# Patient Record
Sex: Male | Born: 1960 | Race: Black or African American | Hispanic: No | Smoking: Former smoker
Health system: Southern US, Community
[De-identification: ages and names within clinical notes are randomized; demographics above are authoritative.]

## PROBLEM LIST (undated history)

## (undated) DIAGNOSIS — J45909 Unspecified asthma, uncomplicated: Secondary | ICD-10-CM

## (undated) HISTORY — PX: PACEMAKER INSERTION: SHX728

---

## 2018-10-17 ENCOUNTER — Emergency Department (HOSPITAL_BASED_OUTPATIENT_CLINIC_OR_DEPARTMENT_OTHER): Payer: Medicaid - Out of State

## 2018-10-17 ENCOUNTER — Encounter (HOSPITAL_BASED_OUTPATIENT_CLINIC_OR_DEPARTMENT_OTHER): Payer: Self-pay | Admitting: Emergency Medicine

## 2018-10-17 ENCOUNTER — Other Ambulatory Visit: Payer: Self-pay

## 2018-10-17 ENCOUNTER — Emergency Department (HOSPITAL_BASED_OUTPATIENT_CLINIC_OR_DEPARTMENT_OTHER)
Admission: EM | Admit: 2018-10-17 | Discharge: 2018-10-17 | Disposition: A | Discharge status: Home / Self Care | Payer: Medicaid - Out of State | Attending: Emergency Medicine | Admitting: Emergency Medicine

## 2018-10-17 DIAGNOSIS — R0602 Shortness of breath: Secondary | ICD-10-CM | POA: Diagnosis present

## 2018-10-17 DIAGNOSIS — Z87891 Personal history of nicotine dependence: Secondary | ICD-10-CM | POA: Diagnosis not present

## 2018-10-17 DIAGNOSIS — J4541 Moderate persistent asthma with (acute) exacerbation: Secondary | ICD-10-CM | POA: Insufficient documentation

## 2018-10-17 DIAGNOSIS — Z20828 Contact with and (suspected) exposure to other viral communicable diseases: Secondary | ICD-10-CM | POA: Diagnosis not present

## 2018-10-17 DIAGNOSIS — R0789 Other chest pain: Secondary | ICD-10-CM | POA: Insufficient documentation

## 2018-10-17 HISTORY — DX: Unspecified asthma, uncomplicated: J45.909

## 2018-10-17 LAB — SARS CORONAVIRUS 2 BY RT PCR (HOSPITAL ORDER, PERFORMED IN ~~LOC~~ HOSPITAL LAB): SARS Coronavirus 2: NEGATIVE

## 2018-10-17 LAB — BASIC METABOLIC PANEL
Anion gap: 8 (ref 5–15)
BUN: 8 mg/dL (ref 6–20)
CO2: 27 mmol/L (ref 22–32)
Calcium: 9 mg/dL (ref 8.9–10.3)
Chloride: 107 mmol/L (ref 98–111)
Creatinine, Ser: 0.95 mg/dL (ref 0.61–1.24)
GFR calc Af Amer: >60 mL/min (ref >60)
GFR calc non Af Amer: >60 mL/min (ref >60)
Glucose, Bld: 107 mg/dL — ABNORMAL HIGH (ref 70–99)
Potassium: 3.9 mmol/L (ref 3.5–5.1)
Sodium: 142 mmol/L (ref 135–145)

## 2018-10-17 LAB — CBC WITH DIFFERENTIAL/PLATELET
Abs Immature Granulocytes: 0.01 10*3/uL (ref 0.00–0.07)
Basophils Absolute: 0 10*3/uL (ref 0.0–0.1)
Basophils Relative: 1 %
Eosinophils Absolute: 0.4 10*3/uL (ref 0.0–0.5)
Eosinophils Relative: 9 %
HCT: 39.5 % (ref 39.0–52.0)
Hemoglobin: 13.1 g/dL (ref 13.0–17.0)
Immature Granulocytes: 0 %
Lymphocytes Relative: 39 %
Lymphs Abs: 1.7 10*3/uL (ref 0.7–4.0)
MCH: 31 pg (ref 26.0–34.0)
MCHC: 33.2 g/dL (ref 30.0–36.0)
MCV: 93.6 fL (ref 80.0–100.0)
Monocytes Absolute: 0.4 10*3/uL (ref 0.1–1.0)
Monocytes Relative: 9 %
Neutro Abs: 1.9 10*3/uL (ref 1.7–7.7)
Neutrophils Relative %: 42 %
Platelets: 158 10*3/uL (ref 150–400)
RBC: 4.22 MIL/uL (ref 4.22–5.81)
RDW: 13.6 % (ref 11.5–15.5)
WBC: 4.5 10*3/uL (ref 4.0–10.5)
nRBC: 0 % (ref 0.0–0.2)

## 2018-10-17 LAB — TROPONIN I (HIGH SENSITIVITY): Troponin I (High Sensitivity): 12 ng/L (ref <18)

## 2018-10-17 MED ORDER — PREDNISONE 20 MG PO TABS: 60.0000 mg | ORAL_TABLET | Freq: Every day | ORAL | 0 refills | Status: AC

## 2018-10-17 MED ORDER — MAGNESIUM SULFATE 2 GM/50ML IV SOLN
2.0000 g | Freq: Once | INTRAVENOUS | Status: AC
  Administered 2018-10-17: 04:00:00 2 g via INTRAVENOUS
  Filled 2018-10-17: qty 50

## 2018-10-17 MED ORDER — IPRATROPIUM BROMIDE HFA 17 MCG/ACT IN AERS
4.0000 | INHALATION_SPRAY | Freq: Once | RESPIRATORY_TRACT | Status: AC
  Administered 2018-10-17: 04:00:00 4 via RESPIRATORY_TRACT

## 2018-10-17 MED ORDER — ALBUTEROL SULFATE HFA 108 (90 BASE) MCG/ACT IN AERS: 2.0000 | INHALATION_SPRAY | RESPIRATORY_TRACT | 1 refills | Status: AC | PRN

## 2018-10-17 MED ORDER — ALBUTEROL SULFATE HFA 108 (90 BASE) MCG/ACT IN AERS
8.0000 | INHALATION_SPRAY | Freq: Once | RESPIRATORY_TRACT | Status: AC
  Administered 2018-10-17: 8 via RESPIRATORY_TRACT

## 2018-10-17 MED ORDER — METHYLPREDNISOLONE SODIUM SUCC 125 MG IJ SOLR
125.0000 mg | Freq: Once | INTRAMUSCULAR | Status: AC
  Administered 2018-10-17: 125 mg via INTRAVENOUS
  Filled 2018-10-17: qty 2

## 2018-10-17 MED ORDER — ALBUTEROL (5 MG/ML) CONTINUOUS INHALATION SOLN
10.0000 mg/h | INHALATION_SOLUTION | RESPIRATORY_TRACT | Status: DC
  Administered 2018-10-17: 10 mg/h via RESPIRATORY_TRACT
  Filled 2018-10-17: qty 20

## 2018-10-17 NOTE — ED Triage Notes (Signed)
Pt states he has asthma and his chest started getting tight about an hour ago  Pt states he has wheezing  Pt is out of his inhaler

## 2018-10-17 NOTE — ED Provider Notes (Addendum)
TIME SEEN: 3:35 AM  CHIEF COMPLAINT: Chest tightness, shortness of breath, wheezing  HPI: Patient is a 58 year old male with history of asthma with multiple previous admissions for asthma exacerbations who presents to the emergency department several hours of wheezing, shortness of breath and chest tightness that feels similar to his previous asthma exacerbations.  No fever or cough.  States he has recently had a negative COVID test within the past 2 weeks.  No lower extremity swelling or pain.  He has never been intubated for his asthma before.  He has been out of his inhalers for several days.  ROS: See HPI Constitutional: no fever  Eyes: no drainage  ENT: no runny nose   Cardiovascular:   chest pain  Resp:  SOB  GI: no vomiting GU: no dysuria Integumentary: no rash  Allergy: no hives  Musculoskeletal: no leg swelling  Neurological: no slurred speech ROS otherwise negative  PAST MEDICAL HISTORY/PAST SURGICAL HISTORY:  Past Medical History:  Diagnosis Date  . Asthma     MEDICATIONS:  Prior to Admission medications   Not on File    ALLERGIES:  No Known Allergies  SOCIAL HISTORY:  Social History   Tobacco Use  . Smoking status: Former Research scientist (life sciences)  . Smokeless tobacco: Never Used  Substance Use Topics  . Alcohol use: Yes    Comment: occ    FAMILY HISTORY: Family History  Problem Relation Age of Onset  . Asthma Other     EXAM: BP (!) 149/85   Pulse 78   Temp 98.1 F (36.7 C) (Oral)   Resp 20   Ht 6' (1.829 m)   Wt 120.2 kg   SpO2 97%   BMI 35.94 kg/m  CONSTITUTIONAL: Alert and oriented and responds appropriately to questions.  Obese, in mild respiratory distress HEAD: Normocephalic EYES: Conjunctivae clear, pupils appear equal, EOMI ENT: normal nose; moist mucous membranes NECK: Supple, no meningismus, no nuchal rigidity, no LAD  CARD: RRR; S1 and S2 appreciated; no murmurs, no clicks, no rubs, no gallops RESP: Patient is tachypneic with increased work of  breathing.  He appears slightly uncomfortable and in mild respiratory distress.  Speaking short sentences.  He has inspiratory and expiratory wheezing on exam.  Diminished aeration at bases bilaterally.  No rhonchi or rales. ABD/GI: Normal bowel sounds; non-distended; soft, non-tender, no rebound, no guarding, no peritoneal signs, no hepatosplenomegaly BACK:  The back appears normal and is non-tender to palpation, there is no CVA tenderness EXT: Normal ROM in all joints; non-tender to palpation; no edema; normal capillary refill; no cyanosis, no calf tenderness or swelling    SKIN: Normal color for age and race; warm; no rash NEURO: Moves all extremities equally PSYCH: The patient's mood and manner are appropriate. Grooming and personal hygiene are appropriate.  MEDICAL DECISION MAKING: Patient here with asthma exacerbation.  Reports multiple admissions for the same.  Patient is in mild respiratory distress currently without hypoxia but does have increased work of breathing.  Given albuterol and Atrovent inhalers without much relief.  After lengthy discussion with patient, respiratory therapy, decision made to give nebulizer treatments.  He has had recent negative COVID test.  Will obtain labs, chest x-ray and COVID swab today.  Will perform rapid COVID swab in case patient needs admission with continued nebulizer treatments.  Patient may need admission for his asthma exacerbation if he does not improve.  Will give IV magnesium and Solu-Medrol.  ED PROGRESS: Patient's labs unremarkable including normal troponin.  Chest x-ray shows  cardiomegaly but otherwise clear.  COVID negative.  Reports feeling much better after nebulizer treatment.  Lungs are now clear and he has no increased work of breathing, tachypnea or hypoxia.  Speaking full sentences.  I feel he is safe to be discharged.  Provided with albuterol inhaler here in the emergency department.  Discussed return precautions.  He is comfortable with this  plan.   At this time, I do not feel there is any life-threatening condition present. I have reviewed and discussed all results (EKG, imaging, lab, urine as appropriate) and exam findings with patient/family. I have reviewed nursing notes and appropriate previous records.  I feel the patient is safe to be discharged home without further emergent workup and can continue workup as an outpatient as needed. Discussed usual and customary return precautions. Patient/family verbalize understanding and are comfortable with this plan.  Outpatient follow-up has been provided as needed. All questions have been answered.     EKG Interpretation  Date/Time:  Tuesday October 17 2018 03:49:31 EDT Ventricular Rate:  71 PR Interval:    QRS Duration: 141 QT Interval:  414 QTC Calculation: 450 R Axis:   -64 Text Interpretation:  Sinus rhythm Left bundle branch block No old tracing to compare Confirmed by Abbee Cremeens, Baxter Hire [phone]) on 10/17/2018 3:52:23 AM        CRITICAL CARE Performed by: Baxter Hire Mattea Seger   Total critical care time: 45 minutes  Critical care time was exclusive of separately billable procedures and treating other patients.  Critical care was necessary to treat or prevent imminent or life-threatening deterioration.  Critical care was time spent personally by me on the following activities: development of treatment plan with patient and/or surrogate as well as nursing, discussions with consultants, evaluation of patient's response to treatment, examination of patient, obtaining history from patient or surrogate, ordering and performing treatments and interventions, ordering and review of laboratory studies, ordering and review of radiographic studies, pulse oximetry and re-evaluation of patient's condition.   Kenn Rekowski was evaluated in Emergency Department on 10/17/2018 for the symptoms described in the history of present illness. He was evaluated in the context of the global COVID-19  pandemic, which necessitated consideration that the patient might be at risk for infection with the SARS-CoV-2 virus that causes COVID-19. Institutional protocols and algorithms that pertain to the evaluation of patients at risk for COVID-19 are in a state of rapid change based on information released by regulatory bodies including the CDC and federal and state organizations. These policies and algorithms were followed during the patient's care in the ED.    Jonas Goh, Layla Maw, DO 10/17/18 0515    Curlie Macken, Layla Maw, DO 10/17/18 [phone]

## 2018-10-17 NOTE — ED Notes (Signed)
ED Provider at bedside. 

## 2018-10-17 NOTE — Discharge Instructions (Signed)
You may use your albuterol inhaler 2 to 4 puffs every 2-4 hours as needed for shortness of breath and wheezing.

## 2021-02-09 IMAGING — DX DG CHEST 1V PORT
1 series · 1 of 1 positions shown · non-contrast
Comparison: None.

CLINICAL DATA: Asthma with chest tightness

EXAM:
PORTABLE CHEST 1 VIEW

[chest ap]
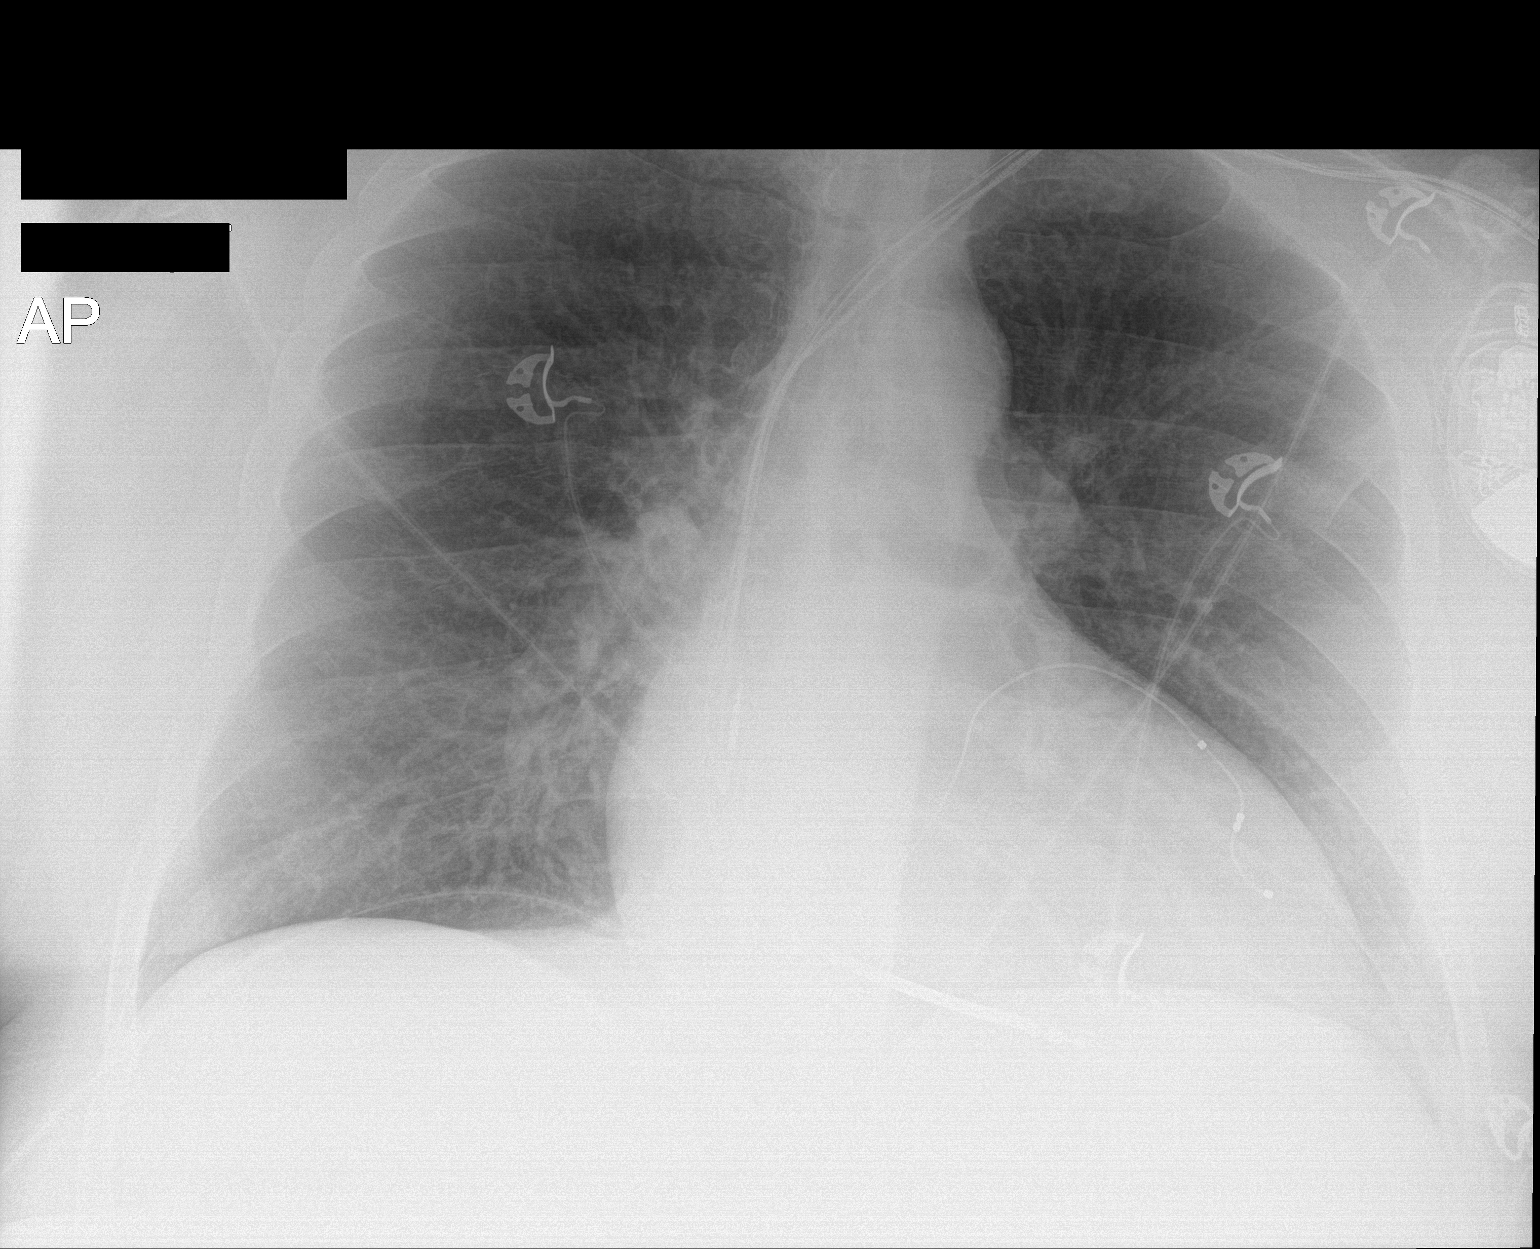

[1 of 1 positions shown; findings below may reference images not displayed]

FINDINGS: Cardiomegaly with biventricular ICD/pacer. There is no edema,
consolidation, effusion, or pneumothorax. Normal heart size and
mediastinal contours.
IMPRESSION: No acute finding.

Cardiomegaly.
